# Patient Record
Sex: Male | Born: 1975 | Race: White | Hispanic: No | Marital: Single | Smoking: Never smoker
Health system: Southern US, Community
[De-identification: ages and names within clinical notes are randomized; demographics above are authoritative.]

## PROBLEM LIST (undated history)

## (undated) DIAGNOSIS — E669 Obesity, unspecified: Secondary | ICD-10-CM

## (undated) DIAGNOSIS — J45909 Unspecified asthma, uncomplicated: Secondary | ICD-10-CM

## (undated) HISTORY — PX: DENTAL SURGERY: SHX609

## (undated) HISTORY — PX: TONSILLECTOMY: SUR1361

---

## 1999-07-01 ENCOUNTER — Emergency Department (HOSPITAL_COMMUNITY): Admission: EM | Admit: 1999-07-01 | Discharge: 1999-07-02 | Payer: Self-pay | Admitting: Emergency Medicine

## 2001-11-25 ENCOUNTER — Emergency Department (HOSPITAL_COMMUNITY): Admission: EM | Admit: 2001-11-25 | Discharge: 2001-11-26 | Payer: Self-pay | Admitting: Emergency Medicine

## 2001-11-26 ENCOUNTER — Encounter: Payer: Self-pay | Admitting: Emergency Medicine

## 2006-12-29 ENCOUNTER — Emergency Department (HOSPITAL_COMMUNITY): Admission: EM | Admit: 2006-12-29 | Discharge: 2006-12-29 | Payer: Self-pay | Admitting: Emergency Medicine

## 2008-12-29 ENCOUNTER — Emergency Department (HOSPITAL_COMMUNITY): Admission: EM | Admit: 2008-12-29 | Discharge: 2008-12-29 | Payer: Self-pay | Admitting: Emergency Medicine

## 2011-01-29 LAB — URINALYSIS, ROUTINE W REFLEX MICROSCOPIC
Bilirubin Urine: NEGATIVE
Glucose, UA: NEGATIVE
Hgb urine dipstick: NEGATIVE
Ketones, ur: NEGATIVE
Nitrite: NEGATIVE
Protein, ur: NEGATIVE
Specific Gravity, Urine: 1.019
Urobilinogen, UA: 0.2
pH: 5.5

## 2014-06-29 ENCOUNTER — Encounter (HOSPITAL_COMMUNITY): Payer: Self-pay | Admitting: Emergency Medicine

## 2014-06-29 ENCOUNTER — Emergency Department (HOSPITAL_COMMUNITY): Payer: Self-pay

## 2014-06-29 ENCOUNTER — Emergency Department (HOSPITAL_COMMUNITY)
Admission: EM | Admit: 2014-06-29 | Discharge: 2014-06-29 | Disposition: A | Discharge status: Home / Self Care | Payer: Self-pay | Attending: Emergency Medicine | Admitting: Emergency Medicine

## 2014-06-29 DIAGNOSIS — Z79899 Other long term (current) drug therapy: Secondary | ICD-10-CM | POA: Insufficient documentation

## 2014-06-29 DIAGNOSIS — R0602 Shortness of breath: Secondary | ICD-10-CM

## 2014-06-29 DIAGNOSIS — J45901 Unspecified asthma with (acute) exacerbation: Secondary | ICD-10-CM | POA: Insufficient documentation

## 2014-06-29 HISTORY — DX: Unspecified asthma, uncomplicated: J45.909

## 2014-06-29 LAB — CBC
HCT: 40.6 % (ref 39.0–52.0)
HEMOGLOBIN: 13.2 g/dL (ref 13.0–17.0)
MCH: 28.3 pg (ref 26.0–34.0)
MCHC: 32.5 g/dL (ref 30.0–36.0)
MCV: 87.1 fL (ref 78.0–100.0)
PLATELETS: 201 10*3/uL (ref 150–400)
RBC: 4.66 MIL/uL (ref 4.22–5.81)
RDW: 13.3 % (ref 11.5–15.5)
WBC: 5.9 10*3/uL (ref 4.0–10.5)

## 2014-06-29 LAB — BASIC METABOLIC PANEL
Anion gap: 9 (ref 5–15)
BUN: 8 mg/dL (ref 6–23)
CO2: 27 mmol/L (ref 19–32)
Calcium: 9.3 mg/dL (ref 8.4–10.5)
Chloride: 104 mmol/L (ref 96–112)
Creatinine, Ser: 0.92 mg/dL (ref 0.50–1.35)
GFR calc Af Amer: >90 mL/min (ref >90)
GFR calc non Af Amer: >90 mL/min (ref >90)
GLUCOSE: 107 mg/dL — AB (ref 70–99)
Potassium: 3.9 mmol/L (ref 3.5–5.1)
Sodium: 140 mmol/L (ref 135–145)

## 2014-06-29 LAB — I-STAT TROPONIN, ED: Troponin i, poc: 0.01 ng/mL (ref 0.00–0.08)

## 2014-06-29 LAB — BRAIN NATRIURETIC PEPTIDE: B Natriuretic Peptide: 15 pg/mL (ref 0.0–100.0)

## 2014-06-29 MED ORDER — IPRATROPIUM BROMIDE 0.02 % IN SOLN
1.5000 mg | Freq: Once | RESPIRATORY_TRACT | Status: AC
  Administered 2014-06-29: 1.5 mg via RESPIRATORY_TRACT
  Filled 2014-06-29: qty 7.5

## 2014-06-29 MED ORDER — ALBUTEROL SULFATE HFA 108 (90 BASE) MCG/ACT IN AERS
2.0000 | INHALATION_SPRAY | Freq: Once | RESPIRATORY_TRACT | Status: AC
  Administered 2014-06-29: 2 via RESPIRATORY_TRACT
  Filled 2014-06-29: qty 6.7

## 2014-06-29 MED ORDER — ALBUTEROL SULFATE (2.5 MG/3ML) 0.083% IN NEBU
5.0000 mg | INHALATION_SOLUTION | Freq: Once | RESPIRATORY_TRACT | Status: AC
  Administered 2014-06-29: 5 mg via RESPIRATORY_TRACT

## 2014-06-29 MED ORDER — PREDNISONE 10 MG PO TABS: 40.0000 mg | ORAL_TABLET | Freq: Every day | ORAL | Status: DC

## 2014-06-29 MED ORDER — ALBUTEROL SULFATE (2.5 MG/3ML) 0.083% IN NEBU
INHALATION_SOLUTION | RESPIRATORY_TRACT | Status: DC
Start: 2014-06-29 — End: 2014-06-30
  Filled 2014-06-29: qty 6

## 2014-06-29 MED ORDER — BENZONATATE 100 MG PO CAPS
100.0000 mg | ORAL_CAPSULE | Freq: Once | ORAL | Status: AC
  Administered 2014-06-29: 100 mg via ORAL
  Filled 2014-06-29: qty 1

## 2014-06-29 MED ORDER — ALBUTEROL SULFATE HFA 108 (90 BASE) MCG/ACT IN AERS: 2.0000 | INHALATION_SPRAY | RESPIRATORY_TRACT | Status: AC | PRN

## 2014-06-29 MED ORDER — ALBUTEROL (5 MG/ML) CONTINUOUS INHALATION SOLN
10.0000 mg/h | INHALATION_SOLUTION | Freq: Once | RESPIRATORY_TRACT | Status: AC
  Administered 2014-06-29: 10 mg/h via RESPIRATORY_TRACT
  Filled 2014-06-29: qty 20

## 2014-06-29 MED ORDER — PREDNISONE 20 MG PO TABS
60.0000 mg | ORAL_TABLET | Freq: Once | ORAL | Status: AC
  Administered 2014-06-29: 60 mg via ORAL
  Filled 2014-06-29: qty 3

## 2014-06-29 NOTE — Discharge Instructions (Signed)

## 2014-06-29 NOTE — ED Notes (Signed)
SOB with cough x2 days. Heavy mucus production. Insp/exp wheeze. Obvious distress

## 2014-06-30 NOTE — ED Provider Notes (Signed)
CSN: [id number]     Arrival date & time 06/29/14  1623 History   First MD Initiated Contact with Patient 06/29/14 2035     Chief Complaint  Patient presents with  . Shortness of Breath     (Consider location/radiation/quality/duration/timing/severity/associated sxs/prior Treatment) HPI   Joshua Whitehead is a 39 y/o male with past medical history of asthma who comes in today with complaint of cough 1 week associated with shortness of breath. Patient says the cough has been productive.  He denies any chest pain or other sx.  He has no albuterol at home so he hasn't been taking any.  Past Medical History  Diagnosis Date  . Asthma    No past surgical history on file. History reviewed. No pertinent family history. History  Substance Use Topics  . Smoking status: Never Smoker   . Smokeless tobacco: Not on file  . Alcohol Use: Not on file    Review of Systems  Constitutional: Negative for fever and chills.  Eyes: Negative for redness.  Respiratory: Positive for cough and shortness of breath.   Cardiovascular: Negative for chest pain.  Gastrointestinal: Negative for nausea, vomiting, abdominal pain and diarrhea.  Genitourinary: Negative for dysuria.  Skin: Negative for rash.  Neurological: Negative for headaches.  All other systems reviewed and are negative.     Allergies  Aspirin; Bee pollen; Chocolate; and Pollen extract  Home Medications   Prior to Admission medications   Medication Sig Start Date End Date Taking? Authorizing Provider  Ephedrine-Guaifenesin (PRIMATENE ASTHMA PO) Take 1 capsule by mouth daily as needed (ashtma).   Yes Historical Provider, MD  guaiFENesin (MUCINEX) 600 MG 12 hr tablet Take 600 mg by mouth 2 (two) times daily as needed for cough.   Yes Historical Provider, MD  Pseudoephedrine-APAP-DM (DAYQUIL MULTI-SYMPTOM COLD/FLU PO) Take 5 mLs by mouth every 4 (four) hours as needed (wheezing, cough).   Yes Historical Provider, MD  albuterol (PROVENTIL  HFA;VENTOLIN HFA) 108 (90 BASE) MCG/ACT inhaler Inhale 2 puffs into the lungs every 4 (four) hours as needed for wheezing or shortness of breath. 06/29/14   Silas Flood, MD  predniSONE (DELTASONE) 10 MG tablet Take 4 tablets (40 mg total) by mouth daily. 06/29/14   Silas Flood, MD   BP 145/75 mmHg  Pulse 103  Temp(Src) 98.6 F (37 C)  Resp 19  Ht  (1.702 m)  Wt 423 lb 14.4 oz (192.28 kg)  BMI 66.38 kg/m2  SpO2 96% Physical Exam  Constitutional: He is oriented to person, place, and time. No distress.  HENT:  Head: Normocephalic and atraumatic.  Eyes: EOM are normal. Pupils are equal, round, and reactive to light.  Neck: Normal range of motion. Neck supple.  Cardiovascular: Normal rate.   Pulmonary/Chest: Effort normal. No respiratory distress. He has wheezes. He has no rales.  Abdominal: Soft. There is no tenderness.  Musculoskeletal: Normal range of motion.  Neurological: He is alert and oriented to person, place, and time.  Skin: No rash noted. He is not diaphoretic.  Psychiatric: He has a normal mood and affect.    ED Course  Procedures (including critical care time) Labs Review Labs Reviewed  BASIC METABOLIC PANEL - Abnormal; Notable for the following:    Glucose, Bld 107 (*)    All other components within normal limits  CBC  BRAIN NATRIURETIC PEPTIDE  I-STAT TROPOININ, ED    Imaging Review Dg Chest 2 View (if Patient Has Fever And/or Copd)  06/29/2014   CLINICAL DATA:  Shortness of breath and wheezing 9 days. Cough and congestion.  EXAM: CHEST  2 VIEW  COMPARISON:  None.  FINDINGS: Moderate motion artifact on the lateral film. Lungs are moderately hypoinflated without consolidation or effusion. There is minimal prominence of the perihilar markings likely due to the moderate degree of hypoinflation. Cardiomediastinal silhouette is within normal. Mild degenerative changes of the spine with exaggerated kyphosis ovary lower thoracic spine.  IMPRESSION: Moderate  hypoinflation without acute cardiopulmonary disease.   Electronically Signed   By: Elberta Fortisaniel  Boyle M.D.   On: 06/29/2014 19:50     EKG Interpretation   Date/Time:  Saturday June 29 2014 16:55:52 EST Ventricular Rate:  86 PR Interval:  132 QRS Duration: 86 QT Interval:  362 QTC Calculation: 433 R Axis:   41 Text Interpretation:  Normal sinus rhythm Normal ECG No previous ECGs  available Confirmed by Bebe ShaggyWICKLINE  MD, Dorinda HillNALD ([id number]) on 06/29/2014 8:40:15  PM      MDM   Final diagnoses:  Shortness of breath    Joshua Whitehead is a 39 y/o male with past medical history of asthma who comes in today with complaint of cough 1 week associated with shortness of breath  Exam as above, satting well on RA, mildly increased WOB.  Normal heart rate.  Good bp.  Faint wheezes.  Will obtain basic labs, ekg/cxr  ekg w/o evidence of ischemia, CXR w/o obvious infiltrate, basic labs unremarkable.  Trop also obtained while patient in waiting area and was negative.  Very low suspicion for ACS given this apparent infectious illness as the cause for the cough and no chest pain.  Feel no need to delta trop as we have sufficiently low concern.  Have given one hour cont albut and 40 mg pred.  Patient feeling sig better after nebs.  Given response to nebs, feel this is likely reactive airway as the cause of his SOB, likely on top of a viral URI.  Will start on pred for the next few days.  Will give albut inhaler.  I have discussed the results, Dx and Tx plan with the patient. They expressed understanding and agree with the plan and were told to return to ED with any worsening of condition or concern.    Disposition: Discharge  Condition: Good  Discharge Medication List as of 06/29/2014 10:49 PM    START taking these medications   Details  albuterol (PROVENTIL HFA;VENTOLIN HFA) 108 (90 BASE) MCG/ACT inhaler Inhale 2 puffs into the lungs every 4 (four) hours as needed for wheezing or shortness of breath.,  Starting 06/29/2014, Until Discontinued, Print    predniSONE (DELTASONE) 10 MG tablet Take 4 tablets (40 mg total) by mouth daily., Starting 06/29/2014, Until Discontinued, Print        [facility] [address] [id number] [place] [id number] [phone]  If symptoms worsen   Pt seen in conjunction with Dr. Meredith Pelwickline     Allexis Bordenave, MD 06/30/14 [id number]  Silas FloodErik Kati Riggenbach, MD 07/01/14 [id number]  Zadie Rhineonald Wickline, MD 07/01/14 2242

## 2018-02-03 ENCOUNTER — Encounter (HOSPITAL_COMMUNITY): Payer: Self-pay | Admitting: Emergency Medicine

## 2018-02-03 ENCOUNTER — Other Ambulatory Visit: Payer: Self-pay

## 2018-02-03 ENCOUNTER — Emergency Department (HOSPITAL_COMMUNITY)
Admission: EM | Admit: 2018-02-03 | Discharge: 2018-02-03 | Disposition: A | Discharge status: Home / Self Care | Payer: Self-pay | Attending: Emergency Medicine | Admitting: Emergency Medicine

## 2018-02-03 DIAGNOSIS — K047 Periapical abscess without sinus: Secondary | ICD-10-CM | POA: Insufficient documentation

## 2018-02-03 DIAGNOSIS — J45909 Unspecified asthma, uncomplicated: Secondary | ICD-10-CM | POA: Insufficient documentation

## 2018-02-03 MED ORDER — PENICILLIN V POTASSIUM 500 MG PO TABS: 500.0000 mg | ORAL_TABLET | Freq: Three times a day (TID) | ORAL | 0 refills | Status: DC

## 2018-02-03 MED ORDER — LIDOCAINE-EPINEPHRINE (PF) 2 %-1:200000 IJ SOLN
20.0000 mL | Freq: Once | INTRAMUSCULAR | Status: AC
  Administered 2018-02-03: 20 mL
  Filled 2018-02-03: qty 20

## 2018-02-03 NOTE — ED Triage Notes (Signed)
Patient arrived by self from home. Pt c/o of jaw pain and swelling that started three weeks ago. Pt states they have been breaking out in a sweat. Pt states they have a hx of teeth problems.

## 2018-02-03 NOTE — ED Notes (Signed)
ED Provider at bedside. 

## 2018-02-03 NOTE — ED Provider Notes (Signed)
[facility]-EMERGENCY DEPT Provider Note   CSN: [id number] Arrival date & time: 02/03/18  4098     History   Chief Complaint Chief Complaint  Patient presents with  . Jaw Pain    HPI Joshua Whitehead is a 42 y.o. male.  HPI Patient is a 42 year old male presents the emergency department with worsening left facial swelling and left-sided lower dental pain.  No fevers or chills.  Poor dental decay and dental hygiene.  Has not seen a dentist or physician/medical provider to this point.  He presents because of the worsening swelling of the left side of his face.  His symptoms been persistent for the last 2 to 3 weeks but worsening over the past 24 to 48 hours.  No difficulty breathing or swallowing.  No neck pain.   Past Medical History:  Diagnosis Date  . Asthma     There are no active problems to display for this patient.   History reviewed. No pertinent surgical history.      Home Medications    Prior to Admission medications   Medication Sig Start Date End Date Taking? Authorizing Provider  albuterol (PROVENTIL HFA;VENTOLIN HFA) 108 (90 BASE) MCG/ACT inhaler Inhale 2 puffs into the lungs every 4 (four) hours as needed for wheezing or shortness of breath. 06/29/14   Silas Flood, MD  Ephedrine-Guaifenesin (PRIMATENE ASTHMA PO) Take 1 capsule by mouth daily as needed (ashtma).    [provider]  guaiFENesin (MUCINEX) 600 MG 12 hr tablet Take 600 mg by mouth 2 (two) times daily as needed for cough.    [provider]  penicillin v potassium (VEETID) 500 MG tablet Take 1 tablet (500 mg total) by mouth 3 (three) times daily. 02/03/18   Azalia Bilis, MD  predniSONE (DELTASONE) 10 MG tablet Take 4 tablets (40 mg total) by mouth daily. 06/29/14   Silas Flood, MD  Pseudoephedrine-APAP-DM (DAYQUIL MULTI-SYMPTOM COLD/FLU PO) Take 5 mLs by mouth every 4 (four) hours as needed (wheezing, cough).    [provider]    Family History No  family history on file.  Social History Social History   Tobacco Use  . Smoking status: Never Smoker  Substance Use Topics  . Alcohol use: Not Currently  . Drug use: Not Currently     Allergies   Aspirin; Bee pollen; Chocolate; and Pollen extract   Review of Systems Review of Systems  All other systems reviewed and are negative.    Physical Exam Updated Vital Signs BP (!) 162/119   Pulse (!) 101   Temp 98.5 F (36.9 C) (Oral)   Resp 12   Ht 5\' 3"  (1.6 m)   SpO2 98%   BMI 75.09 kg/m   Physical Exam  Constitutional: He is oriented to person, place, and time. He appears well-developed and well-nourished.  HENT:  Head: Normocephalic.  Left-sided facial swelling.  Anterior neck is normal.  Tolerating secretions.  Oral airway patent.  No trismus.  Poor dental decay throughout.  Gingival swelling and fluctuance of the left lateral lower gumline.  Eyes: EOM are normal.  Neck: Normal range of motion.  Pulmonary/Chest: Effort normal.  Abdominal: He exhibits no distension.  Musculoskeletal: Normal range of motion.  Neurological: He is alert and oriented to person, place, and time.  Psychiatric: He has a normal mood and affect.  Nursing note and vitals reviewed.    ED Treatments / Results  Labs (all labs ordered are listed, but only abnormal results are displayed)  Labs Reviewed - No data to display  EKG None  Radiology No results found.  Procedures .Marland KitchenIncision and Drainage Performed by: Azalia Bilis, MD Authorized by: Azalia Bilis, MD     INCISION AND DRAINAGE Performed by: Azalia Bilis Consent: Verbal consent obtained. Risks and benefits: risks, benefits and alternatives were discussed Time out performed prior to procedure Type: abscess Body area: Left lower lateral gingival line Anesthesia: local infiltration Incision was made with a scalpel. Local anesthetic: lidocaine 2 % with epinephrine Anesthetic total: 5 ml Complexity: complex Blunt  dissection to break up loculations Drainage: purulent Drainage amount: Moderate Packing material: None Patient tolerance: Patient tolerated the procedure well with no immediate complications.     Medications Ordered in ED Medications  lidocaine-EPINEPHrine (XYLOCAINE W/EPI) 2 %-1:200000 (PF) injection 20 mL (has no administration in time range)     Initial Impression / Assessment and Plan / ED Course  I have reviewed the triage vital signs and the nursing notes.  Pertinent labs & imaging results that were available during my care of the patient were reviewed by me and considered in my medical decision making (see chart for details).     Incision and drainage.  Improvement in pain.  Dental follow-up.  Home with antibiotics.  Encouraged to return to the ER for new or worsening symptoms.  Tolerating secretions.  Anterior neck normal.  No signs to suggest Ludwigs angina.  Final Clinical Impressions(s) / ED Diagnoses   Final diagnoses:  Dental abscess    ED Discharge Orders         Ordered    penicillin v potassium (VEETID) 500 MG tablet  3 times daily     02/03/18 0905           Azalia Bilis, MD 02/03/18 [phone]

## 2018-02-03 NOTE — ED Notes (Signed)
Patient given discharge teaching and verbalized understanding. Patient ambulated out of ED with a steady gait. 

## 2018-11-07 ENCOUNTER — Other Ambulatory Visit: Payer: Self-pay

## 2018-11-07 ENCOUNTER — Encounter (HOSPITAL_COMMUNITY): Payer: Self-pay

## 2018-11-07 ENCOUNTER — Emergency Department (HOSPITAL_COMMUNITY): Payer: Self-pay

## 2018-11-07 ENCOUNTER — Emergency Department (HOSPITAL_COMMUNITY)
Admission: EM | Admit: 2018-11-07 | Discharge: 2018-11-07 | Disposition: A | Discharge status: Home / Self Care | Payer: Self-pay | Attending: Emergency Medicine | Admitting: Emergency Medicine

## 2018-11-07 DIAGNOSIS — Z8709 Personal history of other diseases of the respiratory system: Secondary | ICD-10-CM | POA: Insufficient documentation

## 2018-11-07 DIAGNOSIS — Z6841 Body Mass Index (BMI) 40.0 and over, adult: Secondary | ICD-10-CM | POA: Insufficient documentation

## 2018-11-07 DIAGNOSIS — E669 Obesity, unspecified: Secondary | ICD-10-CM | POA: Insufficient documentation

## 2018-11-07 DIAGNOSIS — I10 Essential (primary) hypertension: Secondary | ICD-10-CM | POA: Insufficient documentation

## 2018-11-07 DIAGNOSIS — R0789 Other chest pain: Secondary | ICD-10-CM | POA: Insufficient documentation

## 2018-11-07 HISTORY — DX: Obesity, unspecified: E66.9

## 2018-11-07 LAB — CBC
HCT: 45.9 % (ref 39.0–52.0)
Hemoglobin: 14.4 g/dL (ref 13.0–17.0)
MCH: 28.9 pg (ref 26.0–34.0)
MCHC: 31.4 g/dL (ref 30.0–36.0)
MCV: 92 fL (ref 80.0–100.0)
Platelets: 249 10*3/uL (ref 150–400)
RBC: 4.99 MIL/uL (ref 4.22–5.81)
RDW: 13.1 % (ref 11.5–15.5)
WBC: 8.4 10*3/uL (ref 4.0–10.5)
nRBC: 0 % (ref 0.0–0.2)

## 2018-11-07 LAB — BASIC METABOLIC PANEL
Anion gap: 13 (ref 5–15)
BUN: 11 mg/dL (ref 6–20)
CO2: 25 mmol/L (ref 22–32)
Calcium: 9.4 mg/dL (ref 8.9–10.3)
Chloride: 104 mmol/L (ref 98–111)
Creatinine, Ser: 1.04 mg/dL (ref 0.61–1.24)
GFR calc Af Amer: >60 mL/min (ref >60)
GFR calc non Af Amer: >60 mL/min (ref >60)
Glucose, Bld: 94 mg/dL (ref 70–99)
Potassium: 3.6 mmol/L (ref 3.5–5.1)
Sodium: 142 mmol/L (ref 135–145)

## 2018-11-07 LAB — TROPONIN I (HIGH SENSITIVITY)
Troponin I (High Sensitivity): 4 ng/L (ref <18)
Troponin I (High Sensitivity): 3 ng/L (ref <18)

## 2018-11-07 MED ORDER — SODIUM CHLORIDE 0.9% FLUSH
3.0000 mL | Freq: Once | INTRAVENOUS | Status: AC
  Administered 2018-11-07: 3 mL via INTRAVENOUS

## 2018-11-07 NOTE — ED Triage Notes (Signed)
Patient c/o intermittent mid chest pain x 2 days. Patient also c/o radiation of pain to the left arm. Patient states he has had to use his Albuterol inhaler more than usual.

## 2018-11-07 NOTE — Discharge Instructions (Addendum)
Please stop using the Primatene Mist and pills as much as possible.  This can cause you to have racing heart, abnormal heart rhythms, and high blood pressure.  Your blood pressure was significantly elevated today and needs to be rechecked without Primatene on board.  I am also giving you information about blood pressure management and lifestyle modifications.  Your work-up for your chest pain here was normal.  I do think that some of your chest pain has to do with your medication use as well as your posture.  Your chest wall is tight from forward rounding and is tender and hurts worse when you move.  You need to do some more stretching and try to improve your posture to help open up the chest wall in the front.  Please follow-up closely with the cardiology group.  You will likely need something called Holter monitoring to make sure you are not having an abnormal heart rhythm that is causing you to feel your heart skip and feels short of breath.   You have been diagnosed by your caregiver as having chest wall pain. SEEK IMMEDIATE MEDICAL ATTENTION IF: You develop a fever.  Your chest pains become severe or intolerable.  You develop new, unexplained symptoms (problems).  You develop shortness of breath, nausea, vomiting, sweating or feel light headed.  You develop a new cough or you cough up blood.

## 2018-11-07 NOTE — ED Provider Notes (Signed)
[facility]-EMERGENCY DEPT Provider Note   CSN: [id number] Arrival date & time: 11/07/18  1021    History   Chief Complaint Chief Complaint  Patient presents with   Chest Pain    HPI Joshua Whitehead is a 43 y.o. male.  Who presents emergency department with chief complaint of intermittent chest pain.  He describes the pain as racing heart, palpitations, on the left side.  He states that radiates to his left shoulder and is worse with moving his arms.  It is intermittent.  He has associated shortness of breath sometimes feels like he is going to pass out.  He states that it is been going on for a few weeks.  He seems to be worse when he exerts himself.  Of note the patient has been using Primatene pills and Primatene Mist over-the-counter for asthma treatment.  He uses it almost daily.  He states that he has not used his Primatene pills in about a week.  He does not correlate his symptoms with use of the medication.  He is unsure if he has any history of early MI.  He does not see a doctor and is unsure if he has high cholesterol or hypertension.  He denies hemoptysis cough, unilateral leg swelling, orthopnea, and expected weight gain.  HPI: A 43 year old patient with a history of hypertension and obesity presents for evaluation of chest pain. Initial onset of pain was approximately 3-6 hours ago. The patient's chest pain is worse with exertion. The patient's chest pain is middle- or left-sided, is not well-localized, is not described as heaviness/pressure/tightness, is not sharp and does not radiate to the arms/jaw/neck. The patient does not complain of nausea and denies diaphoresis. The patient has no history of stroke, has no history of peripheral artery disease, has not smoked in the past 90 days, denies any history of treated diabetes, has no relevant family history of coronary artery disease (first degree relative at less than age 41) and has no history of  hypercholesterolemia.   HPI  Past Medical History:  Diagnosis Date   Asthma    Obesity     There are no active problems to display for this patient.   Past Surgical History:  Procedure Laterality Date   DENTAL SURGERY     TONSILLECTOMY          Home Medications    Prior to Admission medications   Medication Sig Start Date End Date Taking? Authorizing Provider  acetaminophen (TYLENOL) 500 MG tablet Take 1,000 mg by mouth every 6 (six) hours as needed for mild pain.   Yes [provider]  Ephedrine-Guaifenesin (PRIMATENE ASTHMA PO) Take 1 capsule by mouth daily as needed (ashtma).   Yes [provider]  EPINEPHrine (PRIMATENE MIST IN) Inhale 1-2 puffs into the lungs daily as needed (sob/wheezing).   Yes [provider]  albuterol (PROVENTIL HFA;VENTOLIN HFA) 108 (90 BASE) MCG/ACT inhaler Inhale 2 puffs into the lungs every 4 (four) hours as needed for wheezing or shortness of breath. 06/29/14   Silas FloodProulx, Erik, MD    Family History Family History  Problem Relation Age of Onset   Asthma Mother     Social History Social History   Tobacco Use   Smoking status: Never Smoker   Smokeless tobacco: Never Used  Substance Use Topics   Alcohol use: Never    Frequency: Never   Drug use: Never     Allergies   Aspirin, Bee pollen, Chocolate, and Pollen extract  Review of Systems Review of Systems  Ten systems reviewed and are negative for acute change, except as noted in the HPI.   Physical Exam Updated Vital Signs BP (!) 142/106    Pulse 69    Temp 98.3 F (36.8 C) (Oral)    Resp 16    Ht 5\' 4"  (1.626 m)    Wt (!) 176.9 kg    SpO2 96%    BMI 66.94 kg/m   Physical Exam Vitals signs and nursing note reviewed.  Constitutional:      General: He is not in acute distress.    Appearance: He is obese. He is not diaphoretic.  HENT:     Head: Normocephalic and atraumatic.  Eyes:     General: No scleral icterus.    Conjunctiva/sclera:  Conjunctivae normal.  Neck:     Musculoskeletal: Normal range of motion and neck supple.  Cardiovascular:     Rate and Rhythm: Normal rate and regular rhythm.     Heart sounds: Normal heart sounds.  Pulmonary:     Effort: Pulmonary effort is normal. No respiratory distress.     Breath sounds: Normal breath sounds.  Chest:     Chest wall: Tenderness present.    Abdominal:     Palpations: Abdomen is soft.     Tenderness: There is no abdominal tenderness.  Skin:    General: Skin is warm and dry.  Neurological:     Mental Status: He is alert.  Psychiatric:        Behavior: Behavior normal.      ED Treatments / Results  Labs (all labs ordered are listed, but only abnormal results are displayed) Labs Reviewed  BASIC METABOLIC PANEL  CBC  TROPONIN I (HIGH SENSITIVITY)  TROPONIN I (HIGH SENSITIVITY)    EKG EKG Interpretation  Date/Time:  Tuesday November 07 2018 10:37:07 EDT Ventricular Rate:  86 PR Interval:    QRS Duration: 91 QT Interval:  370 QTC Calculation: 443 R Axis:   64 Text Interpretation:  Sinus rhythm Low voltage, precordial leads Baseline wander in lead(s) II aVR No significant change since last tracing Confirmed by Linwood DibblesKnapp, Jon [phone]([id number]) on 11/07/2018 10:44:22 AM   Radiology Dg Chest 2 View  Result Date: 11/07/2018 CLINICAL DATA:  Mid chest pain for the past 2 days. EXAM: CHEST - 2 VIEW COMPARISON:  06/29/2014. FINDINGS: Normal sized heart. Clear lungs with normal vascularity. Minimal thoracic spine degenerative changes. IMPRESSION: No acute abnormality. Electronically Signed   By: Beckie SaltsSteven  Reid M.D.   On: 11/07/2018 11:23    Procedures Procedures (including critical care time)  Medications Ordered in ED Medications  sodium chloride flush (NS) 0.9 % injection 3 mL (3 mLs Intravenous Given 11/07/18 1107)     Initial Impression / Assessment and Plan / ED Course  I have reviewed the triage vital signs and the nursing notes.  Pertinent labs & imaging results  that were available during my care of the patient were reviewed by me and considered in my medical decision making (see chart for details).     HEAR Score: 502 43 year old morbidly obese male here with chest pain.  I have reviewed the patient's labs which show 2 high-sensitivity troponins which are negative, BMP without significant abnormality.  CBC also without abnormality.  Personally reviewed the patient's 2 view chest x-ray which shows no acute abnormalities, no fluids or consolidations.  The patient EKG shows normal sinus rhythm.Patient has a heart score of 2 and is PERC negative. I do  think the patient's symptoms are multifactorial.  Firstly he has reproducible chest wall pain, very poor posture with forward shoulder rounding probably heightened by his obese abdomen which is leading to shortened and tight pectoralis muscles.  I believe this is the cause of his chest wall pain and pain with arm movement.  He likely has some spasmodic component.  I have discussed this with the patient.  Secondarily the patient is overusing Primatene for asthma control.  Given the fact that the primary component of his medication is epinephrine I feel this is very likely the underlying cause of his racing heart, feelings of presyncope and shortness of breath.  Differential also includes other tacky arrhythmias.  The patient is advised to discontinue using his Primatene unless it is extremely necessary.  The patient is also advised to follow closely with outpatient cardiology for Holter monitoring and further work-up.  He appears appropriate for discharge at this time without emergent cause of his symptoms.  I discussed return precautions.  Final Clinical Impressions(s) / ED Diagnoses   Final diagnoses:  Atypical chest pain  Hypertension, unspecified type    ED Discharge Orders    None       Margarita Mail, PA-C 11/07/18 1623    Dorie Rank, MD 11/10/18 [phone]

## 2018-11-07 NOTE — ED Notes (Signed)
Patient ambulated to X-ray 

## 2019-10-04 ENCOUNTER — Other Ambulatory Visit: Payer: Self-pay

## 2019-10-04 ENCOUNTER — Emergency Department (HOSPITAL_COMMUNITY): Payer: Self-pay

## 2019-10-04 ENCOUNTER — Emergency Department (HOSPITAL_COMMUNITY)
Admission: EM | Admit: 2019-10-04 | Discharge: 2019-10-04 | Disposition: A | Discharge status: Home / Self Care | Payer: Self-pay | Attending: Emergency Medicine | Admitting: Emergency Medicine

## 2019-10-04 ENCOUNTER — Encounter (HOSPITAL_COMMUNITY): Payer: Self-pay

## 2019-10-04 DIAGNOSIS — Z79899 Other long term (current) drug therapy: Secondary | ICD-10-CM | POA: Insufficient documentation

## 2019-10-04 DIAGNOSIS — M542 Cervicalgia: Secondary | ICD-10-CM | POA: Insufficient documentation

## 2019-10-04 DIAGNOSIS — G43809 Other migraine, not intractable, without status migrainosus: Secondary | ICD-10-CM

## 2019-10-04 DIAGNOSIS — R519 Headache, unspecified: Secondary | ICD-10-CM | POA: Insufficient documentation

## 2019-10-04 DIAGNOSIS — R110 Nausea: Secondary | ICD-10-CM | POA: Insufficient documentation

## 2019-10-04 DIAGNOSIS — J45909 Unspecified asthma, uncomplicated: Secondary | ICD-10-CM | POA: Insufficient documentation

## 2019-10-04 LAB — COMPREHENSIVE METABOLIC PANEL
ALT: 11 U/L (ref 0–44)
AST: 19 U/L (ref 15–41)
Albumin: 4.3 g/dL (ref 3.5–5.0)
Alkaline Phosphatase: 70 U/L (ref 38–126)
Anion gap: 11 (ref 5–15)
BUN: 8 mg/dL (ref 6–20)
CO2: 26 mmol/L (ref 22–32)
Calcium: 9.2 mg/dL (ref 8.9–10.3)
Chloride: 103 mmol/L (ref 98–111)
Creatinine, Ser: 0.92 mg/dL (ref 0.61–1.24)
GFR calc Af Amer: >60 mL/min (ref >60)
GFR calc non Af Amer: >60 mL/min (ref >60)
Glucose, Bld: 91 mg/dL (ref 70–99)
Potassium: 4 mmol/L (ref 3.5–5.1)
Sodium: 140 mmol/L (ref 135–145)
Total Bilirubin: 0.7 mg/dL (ref 0.3–1.2)
Total Protein: 8 g/dL (ref 6.5–8.1)

## 2019-10-04 LAB — CBC WITH DIFFERENTIAL/PLATELET
Abs Immature Granulocytes: 0.04 10*3/uL (ref 0.00–0.07)
Basophils Absolute: 0 10*3/uL (ref 0.0–0.1)
Basophils Relative: 0 %
Eosinophils Absolute: 0.1 10*3/uL (ref 0.0–0.5)
Eosinophils Relative: 1 %
HCT: 43.7 % (ref 39.0–52.0)
Hemoglobin: 14 g/dL (ref 13.0–17.0)
Immature Granulocytes: 1 %
Lymphocytes Relative: 18 %
Lymphs Abs: 1.4 10*3/uL (ref 0.7–4.0)
MCH: 28.9 pg (ref 26.0–34.0)
MCHC: 32 g/dL (ref 30.0–36.0)
MCV: 90.3 fL (ref 80.0–100.0)
Monocytes Absolute: 0.6 10*3/uL (ref 0.1–1.0)
Monocytes Relative: 7 %
Neutro Abs: 5.8 10*3/uL (ref 1.7–7.7)
Neutrophils Relative %: 73 %
Platelets: 239 10*3/uL (ref 150–400)
RBC: 4.84 MIL/uL (ref 4.22–5.81)
RDW: 12.9 % (ref 11.5–15.5)
WBC: 8 10*3/uL (ref 4.0–10.5)
nRBC: 0 % (ref 0.0–0.2)

## 2019-10-04 MED ORDER — METOCLOPRAMIDE HCL 10 MG PO TABS: 10.0000 mg | ORAL_TABLET | Freq: Four times a day (QID) | ORAL | 0 refills | Status: AC | PRN

## 2019-10-04 MED ORDER — METOCLOPRAMIDE HCL 5 MG/ML IJ SOLN
10.0000 mg | Freq: Once | INTRAMUSCULAR | Status: AC
  Administered 2019-10-04: 10 mg via INTRAVENOUS
  Filled 2019-10-04: qty 2

## 2019-10-04 MED ORDER — DIPHENHYDRAMINE HCL 50 MG/ML IJ SOLN
25.0000 mg | Freq: Once | INTRAMUSCULAR | Status: AC
  Administered 2019-10-04: 25 mg via INTRAVENOUS
  Filled 2019-10-04: qty 1

## 2019-10-04 NOTE — ED Notes (Signed)
Pt is still in CT

## 2019-10-04 NOTE — ED Provider Notes (Signed)
[facility]-EMERGENCY DEPT Provider Note   CSN: [id number] Arrival date & time: 10/04/19  1043     History Chief Complaint  Patient presents with  . Nausea  . Blurred Vision  . Headache    [facility] is a 44 y.o. male hx of obesity, asthma here presenting with headache and blurry vision and nausea and vomiting.  Patient has been having headache and blurry vision for the last 3 days.  Patient also has been vomiting as well.  States that he has some upper neck pain as well.  Denies any weakness or numbness or trouble speaking. Denies hx of stroke or cancer or family history of aneurysms.   The history is provided by the patient.       Past Medical History:  Diagnosis Date  . Asthma   . Obesity     There are no problems to display for this patient.   Past Surgical History:  Procedure Laterality Date  . DENTAL SURGERY    . TONSILLECTOMY         Family History  Problem Relation Age of Onset  . Asthma Mother     Social History   Tobacco Use  . Smoking status: Never Smoker  . Smokeless tobacco: Never Used  Vaping Use  . Vaping Use: Never used  Substance Use Topics  . Alcohol use: Never  . Drug use: Never    Home Medications Prior to Admission medications   Medication Sig Start Date End Date Taking? Authorizing Provider  acetaminophen (TYLENOL) 500 MG tablet Take 1,000 mg by mouth every 6 (six) hours as needed for mild pain.    [provider]  albuterol (PROVENTIL HFA;VENTOLIN HFA) 108 (90 BASE) MCG/ACT inhaler Inhale 2 puffs into the lungs every 4 (four) hours as needed for wheezing or shortness of breath. 06/29/14   Silas Flood, MD  Ephedrine-Guaifenesin (PRIMATENE ASTHMA PO) Take 1 capsule by mouth daily as needed (ashtma).    [provider]  EPINEPHrine (PRIMATENE MIST IN) Inhale 1-2 puffs into the lungs daily as needed (sob/wheezing).    [provider]    Allergies    Aspirin, Bee pollen, Chocolate,  and Pollen extract  Review of Systems   Review of Systems  Neurological: Positive for headaches.  All other systems reviewed and are negative.   Physical Exam Updated Vital Signs BP (!) 161/98   Pulse 77   Temp 98 F (36.7 C) (Oral)   Resp 18   Ht 5\' 3"  (1.6 m)   Wt 136.1 kg   SpO2 98%   BMI 53.14 kg/m   Physical Exam Vitals and nursing note reviewed.  Constitutional:      Comments: Slightly uncomfortable   HENT:     Head: Normocephalic.     Mouth/Throat:     Mouth: Mucous membranes are moist.  Eyes:     Extraocular Movements: Extraocular movements intact.     Pupils: Pupils are equal, round, and reactive to light.  Neck:     Comments: Mild paracervical tenderness.  No midline tenderness and normal range of motion. Cardiovascular:     Rate and Rhythm: Normal rate and regular rhythm.     Heart sounds: Normal heart sounds.  Pulmonary:     Effort: Pulmonary effort is normal.     Breath sounds: Normal breath sounds.  Abdominal:     General: Bowel sounds are normal.     Palpations: Abdomen is soft.  Musculoskeletal:  General: Normal range of motion.     Cervical back: Normal range of motion and neck supple.  Skin:    General: Skin is warm.  Neurological:     Mental Status: He is alert and oriented to person, place, and time.     Cranial Nerves: No cranial nerve deficit or facial asymmetry.     Sensory: No sensory deficit.     Motor: No weakness.     Coordination: Romberg sign negative.     Gait: Gait normal.  Psychiatric:        Mood and Affect: Mood normal.        Behavior: Behavior normal.     ED Results / Procedures / Treatments   Labs (all labs ordered are listed, but only abnormal results are displayed) Labs Reviewed  CBC WITH DIFFERENTIAL/PLATELET  COMPREHENSIVE METABOLIC PANEL    EKG None  Radiology CT Head Wo Contrast  Result Date: 10/04/2019 CLINICAL DATA:  Chronic neck pain. Intermittent nausea and blurred vision. EXAM: CT HEAD  WITHOUT CONTRAST CT CERVICAL SPINE WITHOUT CONTRAST TECHNIQUE: Multidetector CT imaging of the head and cervical spine was performed following the standard protocol without intravenous contrast. Multiplanar CT image reconstructions of the cervical spine were also generated. COMPARISON:  None. FINDINGS: CT HEAD FINDINGS Brain: The brain shows a normal appearance without evidence of malformation, atrophy, old or acute small or large vessel infarction, mass lesion, hemorrhage, hydrocephalus or extra-axial collection. Vascular: No hyperdense vessel. No evidence of atherosclerotic calcification. Skull: Normal.  No traumatic finding.  No focal bone lesion. Sinuses/Orbits: Sinuses are clear. Orbits appear normal. Mastoids are clear. Other: None significant CT CERVICAL SPINE FINDINGS Alignment: Normal Skull base and vertebrae: Limited detail because of patient's size. No evidence of fracture. No suspected focal lesion. Soft tissues and spinal canal: Negative Disc levels: No evidence of degenerative spondylosis. No bony canal or foraminal narrowing. Soft tissue detail is markedly limited by patient's size. Upper chest: Negative Other: None IMPRESSION: Head CT: Normal. Cervical spine CT: Limited by patient's size. No abnormality suspected. Electronically Signed   By: Nelson Chimes M.D.   On: 10/04/2019 17:52   CT Cervical Spine Wo Contrast  Result Date: 10/04/2019 CLINICAL DATA:  Chronic neck pain. Intermittent nausea and blurred vision. EXAM: CT HEAD WITHOUT CONTRAST CT CERVICAL SPINE WITHOUT CONTRAST TECHNIQUE: Multidetector CT imaging of the head and cervical spine was performed following the standard protocol without intravenous contrast. Multiplanar CT image reconstructions of the cervical spine were also generated. COMPARISON:  None. FINDINGS: CT HEAD FINDINGS Brain: The brain shows a normal appearance without evidence of malformation, atrophy, old or acute small or large vessel infarction, mass lesion, hemorrhage,  hydrocephalus or extra-axial collection. Vascular: No hyperdense vessel. No evidence of atherosclerotic calcification. Skull: Normal.  No traumatic finding.  No focal bone lesion. Sinuses/Orbits: Sinuses are clear. Orbits appear normal. Mastoids are clear. Other: None significant CT CERVICAL SPINE FINDINGS Alignment: Normal Skull base and vertebrae: Limited detail because of patient's size. No evidence of fracture. No suspected focal lesion. Soft tissues and spinal canal: Negative Disc levels: No evidence of degenerative spondylosis. No bony canal or foraminal narrowing. Soft tissue detail is markedly limited by patient's size. Upper chest: Negative Other: None IMPRESSION: Head CT: Normal. Cervical spine CT: Limited by patient's size. No abnormality suspected. Electronically Signed   By: Nelson Chimes M.D.   On: 10/04/2019 17:52    Procedures Procedures (including critical care time)  Medications Ordered in ED Medications  metoCLOPramide (REGLAN) injection 10  mg (10 mg Intravenous Given 10/04/19 1742)  diphenhydrAMINE (BENADRYL) injection 25 mg (25 mg Intravenous Given 10/04/19 1742)    ED Course  I have reviewed the triage vital signs and the nursing notes.  Pertinent labs & imaging results that were available during my care of the patient were reviewed by me and considered in my medical decision making (see chart for details).    MDM Rules/Calculators/A&P                          ALVIA TORY is a 44 y.o. male here presenting with headache and neck pain.  I think likely migraines.  Given new onset headache, consider brain mass.  Low suspicion for subarachnoid hemorrhage.  Will get CBC, BMP, CT head.  Will give migraine cocktail and reassess.  6:51 PM Labs and CT head/neck unremarkable. Stable for discharge. Likely migraines. Will dc home with motrin, reglan prn.   Final Clinical Impression(s) / ED Diagnoses Final diagnoses:  None    Rx / DC Orders ED Discharge Orders    None         Charlynne Pander, MD 10/04/19 1851

## 2019-10-04 NOTE — ED Triage Notes (Signed)
Patient c/o intermittent nausea and blurred vision and a constant headache x 2 weeks.

## 2019-10-04 NOTE — Discharge Instructions (Signed)
Your CT head was unremarkable today.   Take tylenol, motrin for headache   Take reglan for severe headaches   See neurology for follow up   Return to ER if you have worse headaches, vomiting, blurry vision

## 2020-03-25 ENCOUNTER — Other Ambulatory Visit: Payer: Self-pay

## 2020-03-25 ENCOUNTER — Encounter (HOSPITAL_COMMUNITY): Payer: Self-pay | Admitting: Emergency Medicine

## 2020-03-25 ENCOUNTER — Emergency Department (HOSPITAL_COMMUNITY)
Admission: EM | Admit: 2020-03-25 | Discharge: 2020-03-25 | Disposition: A | Discharge status: Home / Self Care | Payer: Self-pay | Attending: Emergency Medicine | Admitting: Emergency Medicine

## 2020-03-25 DIAGNOSIS — L723 Sebaceous cyst: Secondary | ICD-10-CM | POA: Insufficient documentation

## 2020-03-25 DIAGNOSIS — J45909 Unspecified asthma, uncomplicated: Secondary | ICD-10-CM | POA: Insufficient documentation

## 2020-03-25 DIAGNOSIS — R030 Elevated blood-pressure reading, without diagnosis of hypertension: Secondary | ICD-10-CM

## 2020-03-25 MED ORDER — LIDOCAINE-EPINEPHRINE 2 %-1:100000 IJ SOLN
20.0000 mL | Freq: Once | INTRAMUSCULAR | Status: AC
  Administered 2020-03-25: 20 mL via INTRADERMAL
  Filled 2020-03-25: qty 1

## 2020-03-25 NOTE — ED Provider Notes (Signed)
[facility]-EMERGENCY DEPT Provider Note   CSN: [id number] Arrival date & time: 03/25/20  1028     History Chief Complaint  Patient presents with  . bump on neck    [facility] KLIE[facility] is a 44 y.o. male.  The history is provided by the patient. No language interpreter was used.  Abscess Location:  Head/neck Head/neck abscess location: posterior R neck. Size:  3x3cm Abscess quality: induration, painful, redness and warmth   Red streaking: no   Duration:  1 week Progression:  Worsening Pain details:    Quality:  Aching and pressure   Severity:  Moderate   Timing:  Constant   Progression:  Worsening Chronicity:  New Context: not diabetes, not immunosuppression, not injected drug use, not insect bite/sting and not skin injury   Associated symptoms: no fatigue and no fever        Past Medical History:  Diagnosis Date  . Asthma   . Obesity     There are no problems to display for this patient.   Past Surgical History:  Procedure Laterality Date  . DENTAL SURGERY    . TONSILLECTOMY         Family History  Problem Relation Age of Onset  . Asthma Mother     Social History   Tobacco Use  . Smoking status: Never Smoker  . Smokeless tobacco: Never Used  Vaping Use  . Vaping Use: Never used  Substance Use Topics  . Alcohol use: Never  . Drug use: Never    Home Medications Prior to Admission medications   Medication Sig Start Date End Date Taking? Authorizing Provider  acetaminophen (TYLENOL) 500 MG tablet Take 1,000 mg by mouth every 6 (six) hours as needed for mild pain.    [provider]  albuterol (PROVENTIL HFA;VENTOLIN HFA) 108 (90 BASE) MCG/ACT inhaler Inhale 2 puffs into the lungs every 4 (four) hours as needed for wheezing or shortness of breath. 06/29/14   Silas Flood, MD  Ephedrine-Guaifenesin (PRIMATENE ASTHMA PO) Take 1 capsule by mouth daily as needed (ashtma).    [provider]  EPINEPHrine (PRIMATENE MIST  IN) Inhale 1-2 puffs into the lungs daily as needed (sob/wheezing).    [provider]  metoCLOPramide (REGLAN) 10 MG tablet Take 1 tablet (10 mg total) by mouth every 6 (six) hours as needed for nausea (nausea/headache). 10/04/19   Charlynne Pander, MD    Allergies    Aspirin, Bee pollen, Chocolate, and Pollen extract  Review of Systems   Review of Systems  Constitutional: Negative for fatigue and fever.  Musculoskeletal: Positive for neck pain.  Skin: Negative for wound.  Psychiatric/Behavioral: The patient is not nervous/anxious.     Physical Exam Updated Vital Signs BP (!) 156/117 (BP Location: Left Arm)   Pulse 95   Temp 98.2 F (36.8 C) (Oral)   Resp 18   Ht 5\' 3"  (1.6 m)   Wt (!) 158.8 kg   SpO2 100%   BMI 62.00 kg/m   Physical Exam Vitals and nursing note reviewed.  Constitutional:      General: He is not in acute distress.    Appearance: He is well-developed. He is not diaphoretic.  HENT:     Head: Normocephalic and atraumatic.  Eyes:     General: No scleral icterus.    Conjunctiva/sclera: Conjunctivae normal.  Neck:     Comments: 3x3 cm area of induration ttp warm and red  Cardiovascular:     Rate and  Rhythm: Normal rate and regular rhythm.     Heart sounds: Normal heart sounds.  Pulmonary:     Effort: Pulmonary effort is normal. No respiratory distress.     Breath sounds: Normal breath sounds.  Abdominal:     Palpations: Abdomen is soft.     Tenderness: There is no abdominal tenderness.  Musculoskeletal:     Cervical back: Normal range of motion and neck supple.  Skin:    General: Skin is warm and dry.  Neurological:     Mental Status: He is alert.  Psychiatric:        Behavior: Behavior normal.     ED Results / Procedures / Treatments   Labs (all labs ordered are listed, but only abnormal results are displayed) Labs Reviewed - No data to display  EKG None  Radiology No results found.  Procedures .Marland KitchenIncision and  Drainage  Date/Time: 03/25/2020 11:38 AM Performed by: Arthor Captain, PA-C Authorized by: Arthor Captain, PA-C   Consent:    Consent obtained:  Verbal   Consent given by:  Patient   Risks discussed:  Bleeding, incomplete drainage, pain and damage to other organs   Alternatives discussed:  No treatment Universal protocol:    Procedure explained and questions answered to patient or proxy's satisfaction: yes     Relevant documents present and verified: yes     Test results available and properly labeled: yes     Imaging studies available: yes     Required blood products, implants, devices, and special equipment available: yes     Site/side marked: yes     Immediately prior to procedure a time out was called: yes     Patient identity confirmed:  Verbally with patient Location:    Type:  Cyst   Size:  3 x 3 cm   Location:  Neck   Neck location:  R posterior Pre-procedure details:    Skin preparation:  Betadine Anesthesia (see MAR for exact dosages):    Anesthesia method:  Local infiltration   Local anesthetic:  Lidocaine 2% WITH epi Procedure type:    Complexity:  Complex Procedure details:    Incision types:  Single straight   Incision depth:  Subcutaneous   Scalpel blade:  11   Wound management:  Probed and deloculated, irrigated with saline and extensive cleaning   Drainage:  Purulent   Drainage amount:  Moderate   Wound treatment:  Wound left open   Packing materials:  None Post-procedure details:    Patient tolerance of procedure:  Tolerated well, no immediate complications   (including critical care time)  Medications Ordered in ED Medications  lidocaine-EPINEPHrine (XYLOCAINE W/EPI) 2 %-1:100000 (with pres) injection 20 mL (20 mLs Intradermal Given 03/25/20 1106)    ED Course  I have reviewed the triage vital signs and the nursing notes.  Pertinent labs & imaging results that were available during my care of the patient were reviewed by me and considered in my  medical decision making (see chart for details).    MDM Rules/Calculators/A&P                          Yoni B Kamp presents with abscess. There is no area of retained pus after procedure. The presentation of Gerasimos B Grunden is NOT consistent with necrotizing fascitis or osteomyolitis. There is no evidence of retained foreign body, neurovascular or tendon injury. The presentation of Paiton B Satre is NOT consistent with sepsis and/or bacteremia.  Advised to follow up with a primary care doctor about his blood pressure.  Strict return and follow-up precautions have been given by me personally or by detailed written instructions verbalized by nursing staff using the teach back method to the patient/family/caregiver(s).  Data Reviewed/Counseling: I have reviewed the patient's vital signs, nursing notes, and other relevant tests/information. I had a detailed discussion regarding the historical points, exam findings, and any diagnostic results supporting the discharge diagnosis. I also discussed the need for outpatient follow-up and the need to return to the ED if symptoms worsen or if there are any questions or concerns that arise at home.  Final Clinical Impression(s) / ED Diagnoses Final diagnoses:  Inflamed sebaceous cyst  Elevated blood pressure reading    Rx / DC Orders ED Discharge Orders    None       Arthor Captain, PA-C 03/25/20 1140    Sabas Sous, MD 03/25/20 1520

## 2020-03-25 NOTE — ED Triage Notes (Signed)
States he has had a bump on his neck x1 year, got it scanned x1 month ago but nothing came of it. Noticed it getting bigger and giving him headaches the past month. Area is red, tender and raised.

## 2020-03-25 NOTE — Discharge Instructions (Signed)
Follow up with a primary care doctor to have your blood pressure rechecked. Follow up with [facility] for excision of your cyst.  Contact a health care provider if: Your cyst or abscess returns. You have a fever or chills. You have more redness, swelling, or pain around your incision. You have more fluid or blood coming from your incision. Your incision feels warm to the touch. You have pus or a bad smell coming from your incision. You have red streaks above or below the incision site. Get help right away if: You have severe pain or bleeding. You cannot eat or drink without vomiting. You have decreased urine output. You become short of breath. You have chest pain. You cough up blood. The affected area becomes numb or starts to tingle.

## 2020-12-19 IMAGING — CT CT CERVICAL SPINE W/O CM
3 of 4 series · 11 of 33 positions shown, 13 images · non-contrast
Comparison: None.

CLINICAL DATA: Chronic neck pain. Intermittent nausea and blurred
vision.

EXAM:
CT HEAD WITHOUT CONTRAST
CT CERVICAL SPINE WITHOUT CONTRAST
TECHNIQUE: Multidetector CT imaging of the head and cervical spine was
performed following the standard protocol without intravenous
contrast. Multiplanar CT image reconstructions of the cervical spine
were also generated.

[Series 5: orthogonal bone · axial · 0.23mm/px · z∈[-303,-165]mm · 3 of 106 slices shown, 4 images]
[im 18/106  soft-tissue]
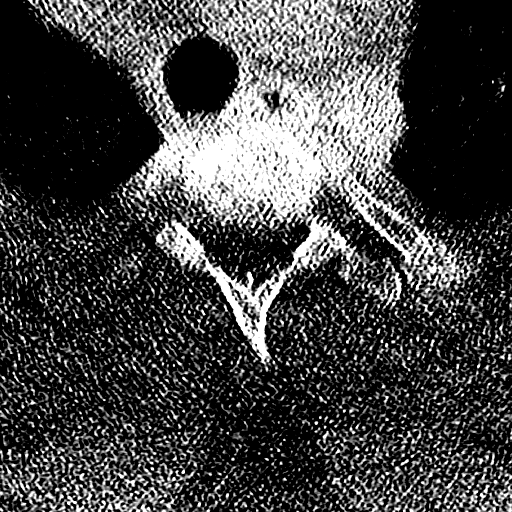
[im 18/106  bone]
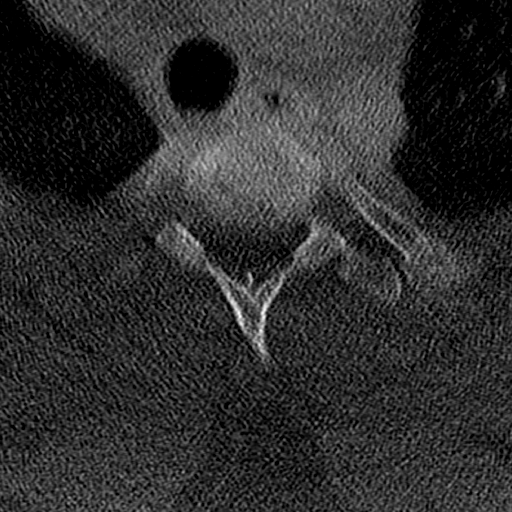
[im 53/106  bone]
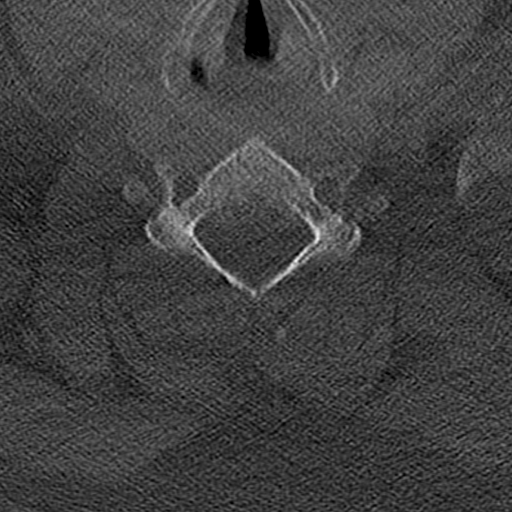
[im 88/106  bone]
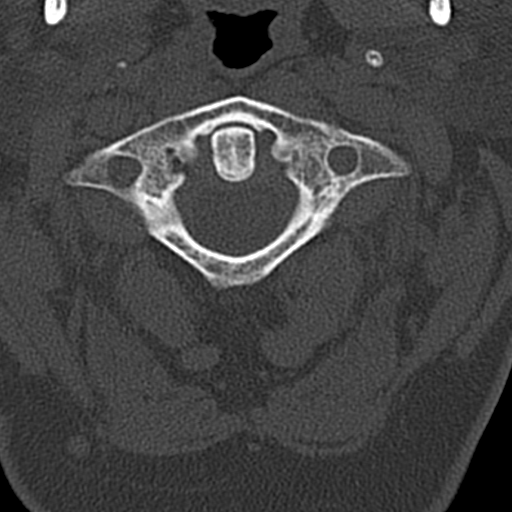

[Series 6: coronal bone · coronal · 0.23mm/px · 3 of 61 slices shown]
[im 13/61  bone]
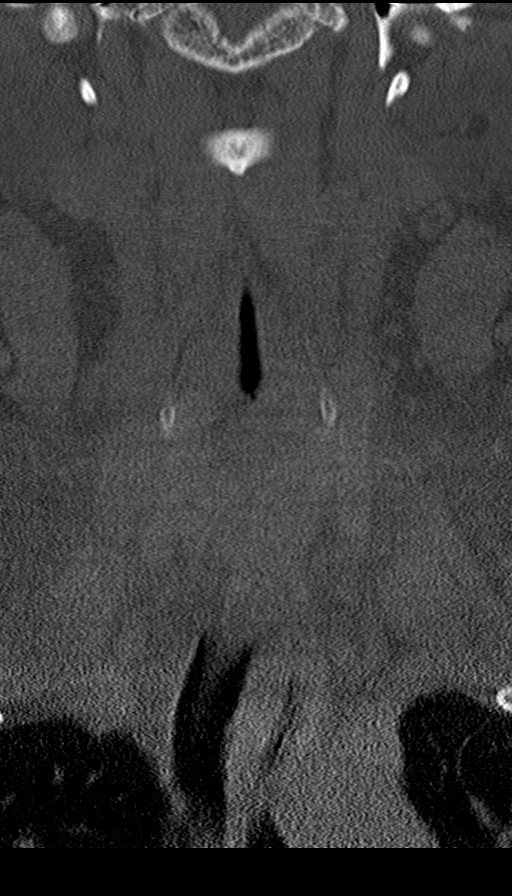
[im 25/61  bone]
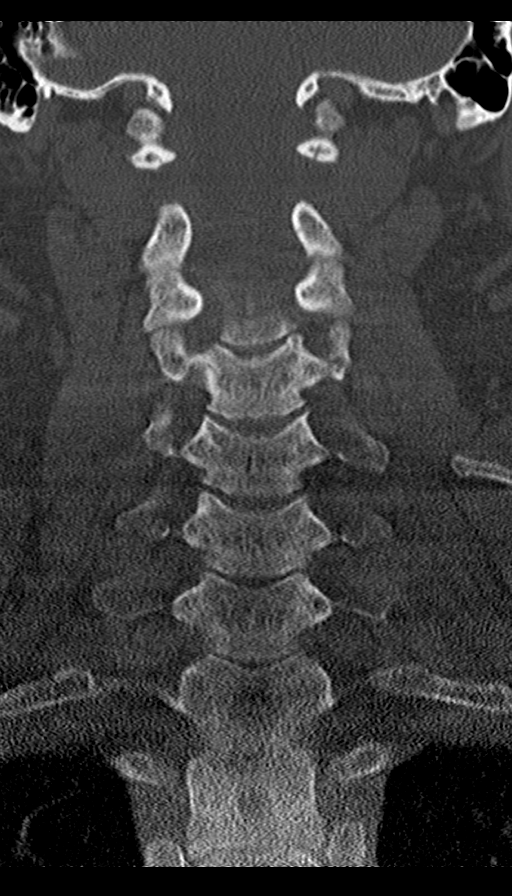
[im 37/61  bone]
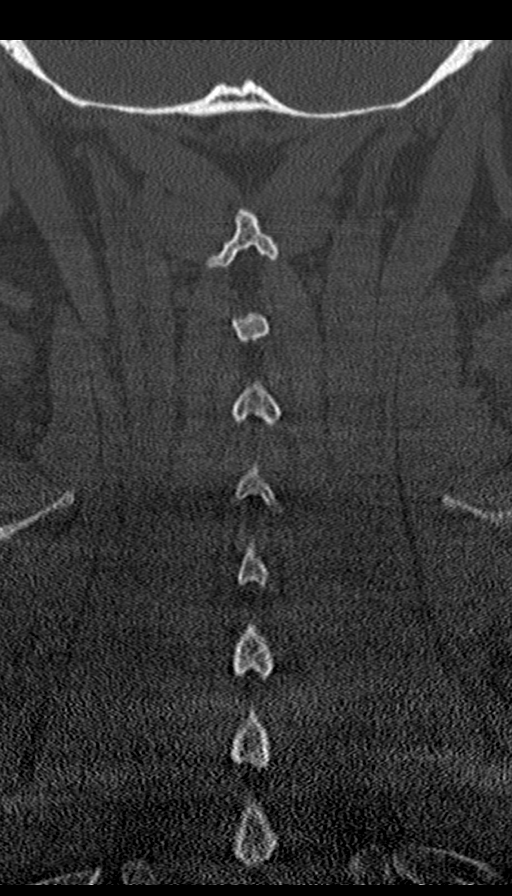

[Series 7: sagittal bone · sagittal · 0.23mm/px · 5 of 61 slices shown, 6 images]
[im 21/61  bone]
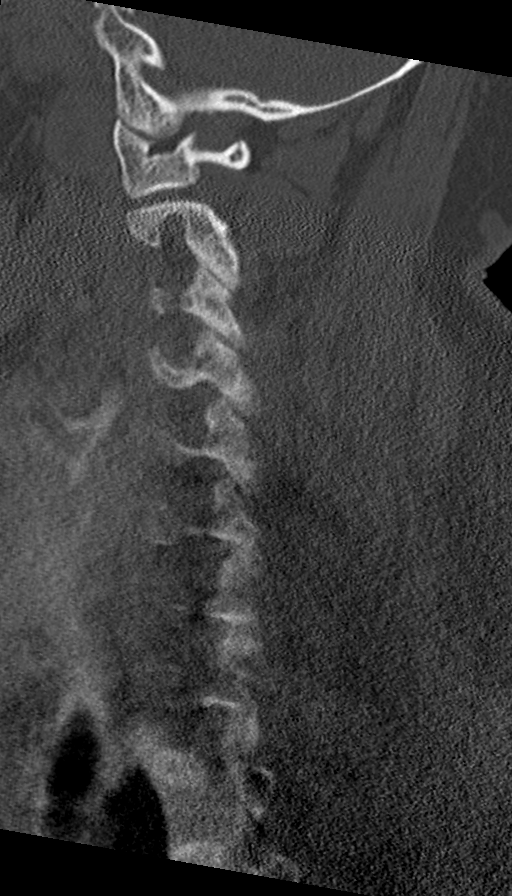
[im 26/61  bone]
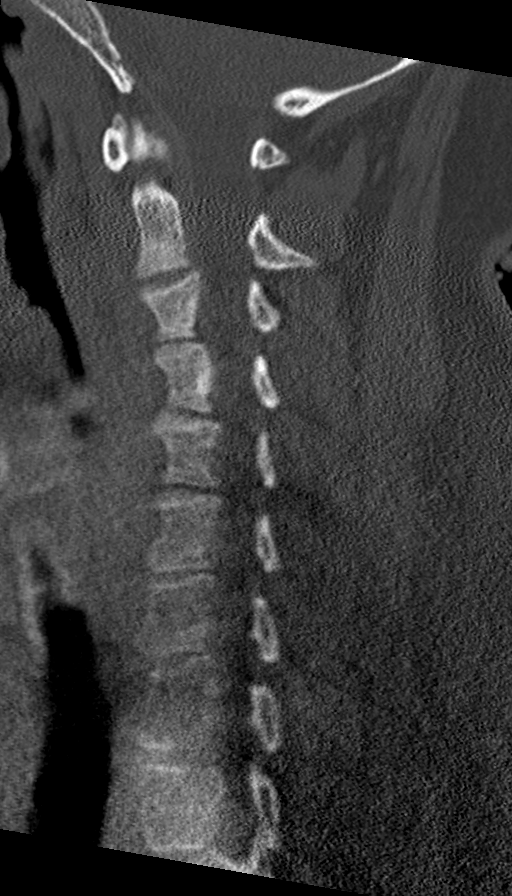
[im 31/61  soft-tissue]
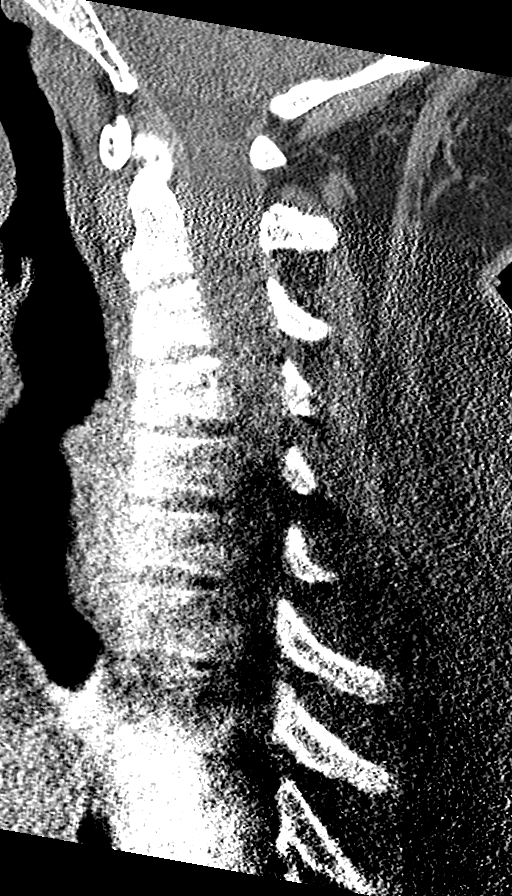
[im 31/61  bone]
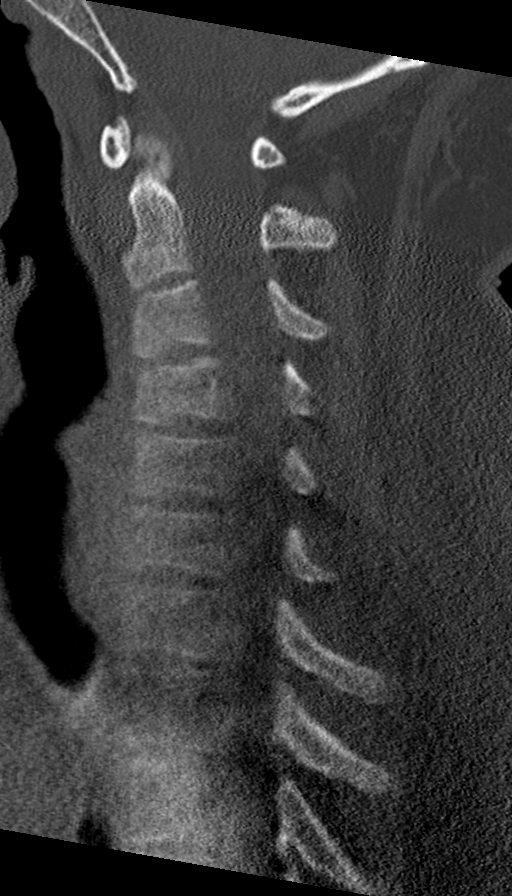
[im 36/61  bone]
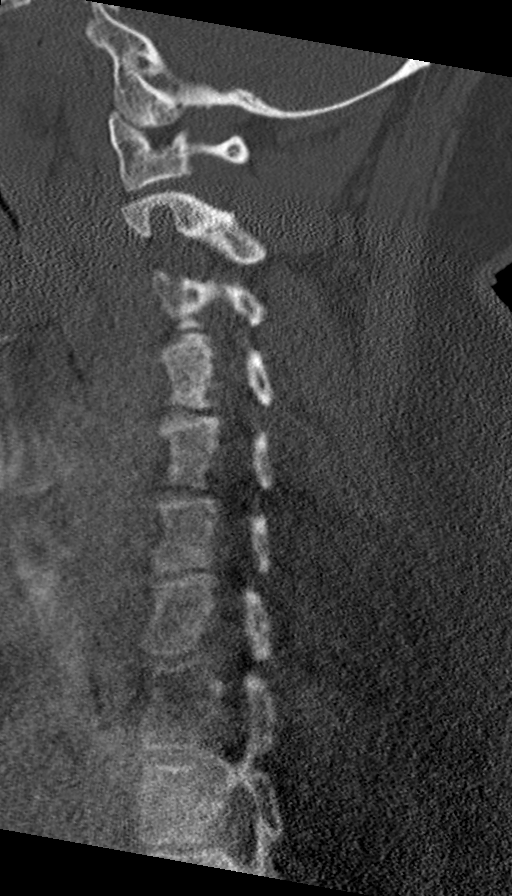
[im 41/61  bone]
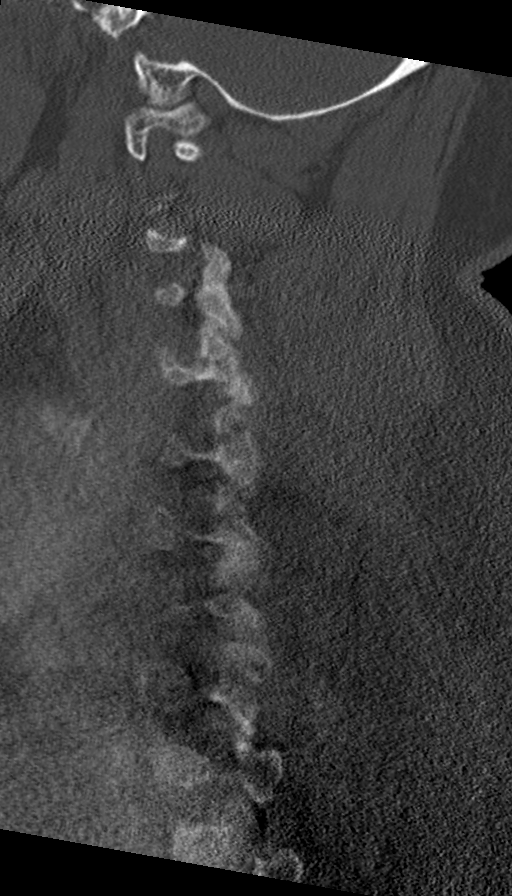

[11 of 33 positions shown; findings below may reference images not displayed]

FINDINGS: CT HEAD FINDINGS

Brain: The brain shows a normal appearance without evidence of
malformation, atrophy, old or acute small or large vessel
infarction, mass lesion, hemorrhage, hydrocephalus or extra-axial
collection.

Vascular: No hyperdense vessel. No evidence of atherosclerotic
calcification.

Skull: Normal.  No traumatic finding.  No focal bone lesion.

Sinuses/Orbits: Sinuses are clear. Orbits appear normal. Mastoids
are clear.

Other: None significant

CT CERVICAL SPINE FINDINGS

Alignment: Normal

Skull base and vertebrae: Limited detail because of patient's size.
No evidence of fracture. No suspected focal lesion.

Soft tissues and spinal canal: Negative

Disc levels: No evidence of degenerative spondylosis. No bony canal
or foraminal narrowing. Soft tissue detail is markedly limited by
patient's size.

Upper chest: Negative

Other: None
IMPRESSION: Head CT: Normal.

Cervical spine CT: Limited by patient's size. No abnormality
suspected.

## 2021-06-27 ENCOUNTER — Other Ambulatory Visit: Payer: Self-pay

## 2021-06-27 ENCOUNTER — Encounter: Payer: Self-pay | Admitting: Emergency Medicine

## 2021-06-27 ENCOUNTER — Ambulatory Visit
Admission: EM | Admit: 2021-06-27 | Discharge: 2021-06-27 | Disposition: A | Discharge status: Home / Self Care | Payer: 59 | Attending: Internal Medicine | Admitting: Internal Medicine

## 2021-06-27 DIAGNOSIS — K047 Periapical abscess without sinus: Secondary | ICD-10-CM | POA: Diagnosis not present

## 2021-06-27 DIAGNOSIS — K0889 Other specified disorders of teeth and supporting structures: Secondary | ICD-10-CM

## 2021-06-27 MED ORDER — LIDOCAINE VISCOUS HCL 2 % MT SOLN: 15.0000 mL | OROMUCOSAL | 0 refills | Status: AC | PRN

## 2021-06-27 MED ORDER — CLINDAMYCIN HCL 150 MG PO CAPS: 450.0000 mg | ORAL_CAPSULE | Freq: Three times a day (TID) | ORAL | 0 refills | Status: AC

## 2021-06-27 NOTE — ED Provider Notes (Signed)
?[facility] ? ? ? ?CSN: [id number] ?Arrival date & time: 06/27/21  1446 ? ? ?  ? ?History   ?Chief Complaint ?Chief Complaint  ?Patient presents with  ? Abscess  ? ? ?HPI ?Joshua Whitehead is a 46 y.o. male.  ? ?Patient presents with dental abscess and dental pain in left upper mouth that started approximately 2 to 3 days ago.  Denies fevers, body aches, chills, purulent drainage.  Patient has taken Tylenol for pain with minimal improvement.  Patient also has elevated blood pressure reading but denies taking any blood pressure medication.  Denies chest pain, shortness of breath, headache, dizziness, blurred vision, nausea, vomiting. ? ? ?Abscess ? ?Past Medical History:  ?Diagnosis Date  ? Asthma   ? Obesity   ? ? ?There are no problems to display for this patient. ? ? ?Past Surgical History:  ?Procedure Laterality Date  ? DENTAL SURGERY    ? TONSILLECTOMY    ? ? ? ? ? ?Home Medications   ? ?Prior to Admission medications   ?Medication Sig Start Date End Date Taking? Authorizing Provider  ?clindamycin (CLEOCIN) 150 MG capsule Take 3 capsules (450 mg total) by mouth 3 (three) times daily for 5 days. 06/27/21 07/02/21 Yes Gustavus BryantMound, Javelle Donigan E, FNP  ?lidocaine (XYLOCAINE) 2 % solution Use as directed 15 mLs in the mouth or throat as needed for mouth pain. Swish and spit 06/27/21  Yes Gustavus BryantMound, Anniyah Mood E, FNP  ?acetaminophen (TYLENOL) 500 MG tablet Take 1,000 mg by mouth every 6 (six) hours as needed for mild pain.    [provider]  ?albuterol (PROVENTIL HFA;VENTOLIN HFA) 108 (90 BASE) MCG/ACT inhaler Inhale 2 puffs into the lungs every 4 (four) hours as needed for wheezing or shortness of breath. 06/29/14   Silas FloodProulx, Erik, MD  ?Ephedrine-Guaifenesin (PRIMATENE ASTHMA PO) Take 1 capsule by mouth daily as needed (ashtma).    [provider]  ?EPINEPHrine (PRIMATENE MIST IN) Inhale 1-2 puffs into the lungs daily as needed (sob/wheezing).    [provider]  ?metoCLOPramide (REGLAN) 10 MG tablet Take 1  tablet (10 mg total) by mouth every 6 (six) hours as needed for nausea (nausea/headache). 10/04/19   Charlynne PanderYao, David Hsienta, MD  ? ? ?Family History ?Family History  ?Problem Relation Age of Onset  ? Asthma Mother   ? ? ?Social History ?Social History  ? ?Tobacco Use  ? Smoking status: Never  ? Smokeless tobacco: Never  ?Vaping Use  ? Vaping Use: Never used  ?Substance Use Topics  ? Alcohol use: Never  ? Drug use: Never  ? ? ? ?Allergies   ?Aspirin, Bee pollen, Chocolate, and Pollen extract ? ? ?Review of Systems ?Review of Systems ?Per HPI ? ?Physical Exam ?Triage Vital Signs ?ED Triage Vitals  ?Enc Vitals Group  ?   BP 06/27/21 1530 (!) 169/123  ?   Pulse Rate 06/27/21 1530 75  ?   Resp 06/27/21 1530 18  ?   Temp 06/27/21 1530 98.3 ?F (36.8 ?C)  ?   Temp Source 06/27/21 1530 Oral  ?   SpO2 06/27/21 1530 95 %  ?   Weight 06/27/21 1531 (!) 350 lb 1.5 oz (158.8 kg)  ?   Height 06/27/21 1531 5\' 3"  (1.6 m)  ?   Head Circumference --   ?   Peak Flow --   ?   Pain Score 06/27/21 1530 5  ?   Pain Loc --   ?   Pain Edu? --   ?  Excl. in GC? --   ? ?No data found. ? ?Updated Vital Signs ?BP (!) 169/123 (BP Location: Left Arm)   Pulse 75   Temp 98.3 ?F (36.8 ?C) (Oral)   Resp 18   Ht 5\' 3"  (1.6 m)   Wt (!) 350 lb 1.5 oz (158.8 kg)   SpO2 95%   BMI 62.02 kg/m?  ? ?Visual Acuity ?Right Eye Distance:   ?Left Eye Distance:   ?Bilateral Distance:   ? ?Right Eye Near:   ?Left Eye Near:    ?Bilateral Near:    ? ?Physical Exam ?Constitutional:   ?   General: He is not in acute distress. ?   Appearance: Normal appearance. He is not toxic-appearing or diaphoretic.  ?HENT:  ?   Head: Normocephalic and atraumatic.  ?   Mouth/Throat:  ?   Lips: Pink.  ?   Mouth: Mucous membranes are moist.  ?   Dentition: Abnormal dentition. Dental tenderness and gingival swelling present.  ?   Comments: Mild gingival swelling and erythema located to left upper lateral dentition. ?Eyes:  ?   Extraocular Movements: Extraocular movements intact.  ?    Conjunctiva/sclera: Conjunctivae normal.  ?Cardiovascular:  ?   Rate and Rhythm: Normal rate and regular rhythm.  ?   Pulses: Normal pulses.  ?   Heart sounds: Normal heart sounds.  ?Pulmonary:  ?   Effort: Pulmonary effort is normal. No respiratory distress.  ?   Breath sounds: Normal breath sounds.  ?Neurological:  ?   General: No focal deficit present.  ?   Mental Status: He is alert and oriented to person, place, and time. Mental status is at baseline.  ?   Cranial Nerves: Cranial nerves 2-12 are intact.  ?   Sensory: Sensation is intact.  ?   Motor: Motor function is intact.  ?   Coordination: Coordination is intact.  ?   Gait: Gait is intact.  ?Psychiatric:     ?   Mood and Affect: Mood normal.     ?   Behavior: Behavior normal.     ?   Thought Content: Thought content normal.     ?   Judgment: Judgment normal.  ? ? ? ?UC Treatments / Results  ?Labs ?(all labs ordered are listed, but only abnormal results are displayed) ?Labs Reviewed - No data to display ? ?EKG ? ? ?Radiology ?No results found. ? ?Procedures ?Procedures (including critical care time) ? ?Medications Ordered in UC ?Medications - No data to display ? ?Initial Impression / Assessment and Plan / UC Course  ?I have reviewed the triage vital signs and the nursing notes. ? ?Pertinent labs & imaging results that were available during my care of the patient were reviewed by me and considered in my medical decision making (see chart for details). ? ?  ? ?Will treat dental infection and dental pain with clindamycin antibiotic due to allergy and viscous lidocaine.  Patient to follow-up with dentist for further evaluation and management. ? ?Patient also has elevated blood pressure reading but pain could be contributing.  Patient was advised to monitor blood pressure at home and to follow-up with PCP if remains elevated.  Neuro exam is normal and no signs of endorgan damage.  Do not think the patient is in need of immediate medical attention at the hospital  at this time.  Patient verbalized understanding and was agreeable with plan. ?Final Clinical Impressions(s) / UC Diagnoses  ? ?Final diagnoses:  ?Dental infection  ?  Pain, dental  ? ? ? ?Discharge Instructions   ? ?  ?You have been prescribed an antibiotic and a lidocaine solution to help alleviate symptoms.  Follow-up with dentist for further evaluation and management. ? ? ? ?ED Prescriptions   ? ? Medication Sig Dispense Auth. Provider  ? clindamycin (CLEOCIN) 150 MG capsule Take 3 capsules (450 mg total) by mouth 3 (three) times daily for 5 days. 45 capsule Gustavus Bryant, [place]  ? lidocaine (XYLOCAINE) 2 % solution Use as directed 15 mLs in the mouth or throat as needed for mouth pain. Swish and spit 100 mL Gustavus Bryant, [place]  ? ?  ? ?PDMP not reviewed this encounter. ?  ?Gustavus Bryant, [place] ?06/27/21 1541 ? ?

## 2021-06-27 NOTE — Discharge Instructions (Signed)
You have been prescribed an antibiotic and a lidocaine solution to help alleviate symptoms.  Follow-up with dentist for further evaluation and management. ?

## 2021-06-27 NOTE — ED Triage Notes (Signed)
Patient c/o abscess in mouth from bad teeth.  Left side of face/mouth swelling x 2-3 days.  Patient has taken Tylenol for the pain. ?
